# Patient Record
Sex: Male | Born: 1960 | Race: Black or African American | Hispanic: No | Marital: Married | Smoking: Never smoker
Health system: Southern US, Community
[De-identification: ages and names within clinical notes are randomized; demographics above are authoritative.]

## PROBLEM LIST (undated history)

## (undated) DIAGNOSIS — K2950 Unspecified chronic gastritis without bleeding: Secondary | ICD-10-CM

## (undated) HISTORY — PX: EYE SURGERY: SHX253

## (undated) HISTORY — DX: Unspecified chronic gastritis without bleeding: K29.50

---

## 1996-06-28 HISTORY — PX: PAROTID GLAND TUMOR EXCISION: SHX5221

## 1998-02-06 ENCOUNTER — Ambulatory Visit (HOSPITAL_COMMUNITY): Admission: RE | Admit: 1998-02-06 | Discharge: 1998-02-06 | Payer: Self-pay | Admitting: Internal Medicine

## 2000-07-15 ENCOUNTER — Emergency Department (HOSPITAL_COMMUNITY): Admission: EM | Admit: 2000-07-15 | Discharge: 2000-07-15 | Payer: Self-pay | Admitting: Emergency Medicine

## 2000-12-07 ENCOUNTER — Encounter: Admission: RE | Admit: 2000-12-07 | Discharge: 2000-12-07 | Payer: Self-pay | Admitting: *Deleted

## 2008-12-02 ENCOUNTER — Ambulatory Visit (HOSPITAL_COMMUNITY): Admission: RE | Admit: 2008-12-02 | Discharge: 2008-12-02 | Payer: Self-pay | Admitting: *Deleted

## 2010-11-10 NOTE — Op Note (Signed)
NAME:  Raymond French, Raymond French              ACCOUNT NO.:  [id number]   MEDICAL RECORD NO.:  [id number]          PATIENT TYPE:  AMB   LOCATION:  ENDO                         FACILITY:  [facility]   PHYSICIAN:  Georgiana Spinner, M.D.    DATE OF BIRTH:  [birth year]   DATE OF PROCEDURE:  12/02/2008  DATE OF DISCHARGE:                               OPERATIVE REPORT   PROCEDURE:  Upper endoscopy.   INDICATIONS:  Gastroesophageal reflux disease.   ANESTHESIA:  Fentanyl 75 mcg, Versed 7 mg.   PROCEDURE:  With the patient mildly sedated in left lateral decubitus  position, the Pentax videoscopic endoscope was inserted and passed under  direct vision through the esophagus which appeared normal into the  stomach.  Fundus, body, antrum, duodenal bulb, second portion duodenum  all appeared normal.  From this point the endoscope slowly withdrawn  taking circumferential views of duodenal mucosa until the endoscope had  been pulled back into the stomach and placed in retroflexion to view the  stomach from below.  The endoscope was straightened and withdrawn taking  circumferential views remaining gastric and esophageal mucosa.  The  patient's vital signs, pulse oximeter remained stable.  The patient  tolerated procedure well without apparent complications.   FINDINGS:  Rather unremarkable examination.   PLAN:  Proceed to colonoscopy.           ______________________________  Georgiana Spinner, M.D.     GMO/MEDQ  D:  12/02/2008  T:  12/02/2008  Job:  [id number]

## 2010-11-10 NOTE — Op Note (Signed)
NAME:  Raymond French, Raymond French              ACCOUNT NO.:  [id number]   MEDICAL RECORD NO.:  [id number]          PATIENT TYPE:  AMB   LOCATION:  ENDO                         FACILITY:  [facility]   PHYSICIAN:  Georgiana Spinner, M.D.    DATE OF BIRTH:  07/28/1960   DATE OF PROCEDURE:  DATE OF DISCHARGE:                               OPERATIVE REPORT   PROCEDURE:  Colonoscopy.   INDICATIONS:  Colon cancer screening, rectal pain.   ANESTHESIA:  Fentanyl 25 mcg, Versed 2.5 mg.   PROCEDURE:  With the patient in the left lateral decubitus position  before receiving any anesthesia I did a rectal examination which was  unremarkable to my exam, both visually and digital exam although,  prostate was only partially felt.  From this point, the colonoscope was  then inserted into the rectum and passed under direct vision with  pressure applied.  We reached the cecum, identified by ileocecal valve  and base of cecum, both of which were photographed.  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  From this point, the colonoscope was slowly withdrawn taking  circumferential views of the colonic mucosa stopping in the rectum which  appeared normal on direct and showed hemorrhoids on retroflexed view.  The endoscope was straightened and withdrawn.  The patient's vital signs  and pulse oximeter remained stable.  The patient tolerated the procedure  well without apparent complication.   FINDINGS:  AVM of cecum.  Internal hemorrhoids.  Otherwise, an  unremarkable exam.   PLAN:  To have patient follow up with me as an outpatient.           ______________________________  Georgiana Spinner, M.D.     GMO/MEDQ  D:  12/02/2008  T:  12/02/2008  Job:  [id number]

## 2010-12-29 ENCOUNTER — Other Ambulatory Visit: Payer: Self-pay | Admitting: Otolaryngology

## 2010-12-29 DIAGNOSIS — R609 Edema, unspecified: Secondary | ICD-10-CM

## 2010-12-31 ENCOUNTER — Other Ambulatory Visit: Payer: Self-pay

## 2014-03-21 ENCOUNTER — Emergency Department (HOSPITAL_COMMUNITY): Payer: No Typology Code available for payment source

## 2014-03-21 ENCOUNTER — Encounter (HOSPITAL_COMMUNITY): Payer: Self-pay | Admitting: Emergency Medicine

## 2014-03-21 ENCOUNTER — Emergency Department (HOSPITAL_COMMUNITY)
Admission: EM | Admit: 2014-03-21 | Discharge: 2014-03-21 | Disposition: A | Discharge status: Home / Self Care | Payer: No Typology Code available for payment source | Attending: Emergency Medicine | Admitting: Emergency Medicine

## 2014-03-21 DIAGNOSIS — Y9389 Activity, other specified: Secondary | ICD-10-CM | POA: Diagnosis not present

## 2014-03-21 DIAGNOSIS — Y92410 Unspecified street and highway as the place of occurrence of the external cause: Secondary | ICD-10-CM | POA: Insufficient documentation

## 2014-03-21 DIAGNOSIS — S46909A Unspecified injury of unspecified muscle, fascia and tendon at shoulder and upper arm level, unspecified arm, initial encounter: Secondary | ICD-10-CM | POA: Diagnosis not present

## 2014-03-21 DIAGNOSIS — S59919A Unspecified injury of unspecified forearm, initial encounter: Secondary | ICD-10-CM | POA: Diagnosis not present

## 2014-03-21 DIAGNOSIS — M25532 Pain in left wrist: Secondary | ICD-10-CM

## 2014-03-21 DIAGNOSIS — S4980XA Other specified injuries of shoulder and upper arm, unspecified arm, initial encounter: Secondary | ICD-10-CM | POA: Diagnosis not present

## 2014-03-21 DIAGNOSIS — S59909A Unspecified injury of unspecified elbow, initial encounter: Secondary | ICD-10-CM | POA: Insufficient documentation

## 2014-03-21 DIAGNOSIS — M25531 Pain in right wrist: Secondary | ICD-10-CM

## 2014-03-21 DIAGNOSIS — S6990XA Unspecified injury of unspecified wrist, hand and finger(s), initial encounter: Secondary | ICD-10-CM | POA: Insufficient documentation

## 2014-03-21 MED ORDER — TRAMADOL HCL 50 MG PO TABS: 50.0000 mg | ORAL_TABLET | Freq: Four times a day (QID) | ORAL | Status: DC | PRN

## 2014-03-21 MED ORDER — CYCLOBENZAPRINE HCL 10 MG PO TABS: 10.0000 mg | ORAL_TABLET | Freq: Two times a day (BID) | ORAL | Status: DC | PRN

## 2014-03-21 MED ORDER — HYDROCODONE-ACETAMINOPHEN 5-325 MG PO TABS
2.0000 | ORAL_TABLET | Freq: Once | ORAL | Status: AC
  Administered 2014-03-21: 2 via ORAL
  Filled 2014-03-21: qty 2

## 2014-03-21 NOTE — Discharge Instructions (Signed)
Take Tramadol as needed for pain. Take Flexeril as needed for muscle spasm. Refer to attached documents for more information.  °

## 2014-03-21 NOTE — Progress Notes (Signed)
[facility] Community Liaison Stacy, ° °Provided pt with a list of self-pay providers to help patient establish primary care.  °
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
## 2014-03-24 NOTE — ED Provider Notes (Signed)
Medical screening examination/treatment/procedure(s) were performed by non-physician practitioner and as supervising physician I was immediately available for consultation/collaboration.   EKG Interpretation None        Britani Beattie, MD 03/24/14 1535 

## 2020-08-18 ENCOUNTER — Other Ambulatory Visit: Payer: Self-pay

## 2020-08-18 ENCOUNTER — Emergency Department (HOSPITAL_COMMUNITY)
Admission: EM | Admit: 2020-08-18 | Discharge: 2020-08-18 | Disposition: A | Discharge status: Home / Self Care | Payer: Commercial Managed Care - PPO | Attending: Emergency Medicine | Admitting: Emergency Medicine

## 2020-08-18 ENCOUNTER — Encounter (HOSPITAL_COMMUNITY): Payer: Self-pay | Admitting: Emergency Medicine

## 2020-08-18 DIAGNOSIS — S161XXA Strain of muscle, fascia and tendon at neck level, initial encounter: Secondary | ICD-10-CM | POA: Insufficient documentation

## 2020-08-18 DIAGNOSIS — Y92410 Unspecified street and highway as the place of occurrence of the external cause: Secondary | ICD-10-CM | POA: Diagnosis not present

## 2020-08-18 DIAGNOSIS — M545 Low back pain, unspecified: Secondary | ICD-10-CM | POA: Diagnosis not present

## 2020-08-18 DIAGNOSIS — S199XXA Unspecified injury of neck, initial encounter: Secondary | ICD-10-CM | POA: Diagnosis present

## 2020-08-18 MED ORDER — METHOCARBAMOL 500 MG PO TABS: 500.0000 mg | ORAL_TABLET | Freq: Two times a day (BID) | ORAL | 0 refills | Status: DC

## 2020-08-18 NOTE — Discharge Instructions (Signed)
Please pick up medication and take as prescribed. DO NOT DRIVE WHILE ON THE MUSCLE RELAXER AS IT CAN MAKE YOU DROWSY.   Continue taking the meloxicam that you have prescribed as well.   Please follow up with your PCP regarding your ED visit today and for reevaluation of your symptoms.   Return to the ED IMMEDIATELY for any worsening symptoms including worsening pain, new weakness/numbness/tingling to your arms or legs, numbness in your groin, inability to ambulate, holding onto urine, peeing or pooping on yourself, or any other new/concerning symptoms.
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
## 2021-08-14 ENCOUNTER — Ambulatory Visit (HOSPITAL_COMMUNITY)
Admission: EM | Admit: 2021-08-14 | Discharge: 2021-08-14 | Disposition: A | Discharge status: Home / Self Care | Payer: 59 | Attending: Family Medicine | Admitting: Family Medicine

## 2021-08-14 ENCOUNTER — Encounter (HOSPITAL_COMMUNITY): Payer: Self-pay

## 2021-08-14 ENCOUNTER — Ambulatory Visit (INDEPENDENT_AMBULATORY_CARE_PROVIDER_SITE_OTHER): Payer: 59

## 2021-08-14 DIAGNOSIS — M79674 Pain in right toe(s): Secondary | ICD-10-CM | POA: Diagnosis not present

## 2021-08-14 MED ORDER — IBUPROFEN 800 MG PO TABS: 800.0000 mg | ORAL_TABLET | Freq: Three times a day (TID) | ORAL | 0 refills | Status: DC | PRN

## 2021-08-14 NOTE — Discharge Instructions (Addendum)
Your x-ray is negative for fracture. You can take ibuprofen 800 mg 1 every 8 hours as needed for pain  Ice and elevate your foot
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
## 2021-08-14 NOTE — ED Provider Notes (Addendum)
[facility]    CSN: [id number] Arrival date & time: 08/14/21  1851      History   Chief Complaint Chief Complaint  Patient presents with   Toe Injury    HPI Raymond French is a 61 y.o. male.   HPI Here for right fourth and fifth toe pain.  This began this morning after he stubbed his lateral toes on a couch.    History reviewed. No pertinent past medical history.  There are no problems to display for this patient.   Past Surgical History:  Procedure Laterality Date   EYE SURGERY     right eye       Home Medications    Prior to Admission medications   Medication Sig Start Date End Date Taking? Authorizing Provider  ibuprofen (ADVIL) 800 MG tablet Take 1 tablet (800 mg total) by mouth every 8 (eight) hours as needed (pain). 08/14/21  Yes Fredricka Kohrs, Janace Aris, MD    Family History Family History  Family history unknown: Yes    Social History Social History   Tobacco Use   Smoking status: Never  Substance Use Topics   Alcohol use: Yes     Allergies   Patient has no known allergies.   Review of Systems Review of Systems   Physical Exam Triage Vital Signs ED Triage Vitals  Enc Vitals Group     BP 08/14/21 1958 139/86     Pulse Rate 08/14/21 1958 77     Resp 08/14/21 1958 18     Temp 08/14/21 1958 98.5 F (36.9 C)     Temp Source 08/14/21 1958 Oral     SpO2 08/14/21 1958 100 %     Weight --      Height --      Head Circumference --      Peak Flow --      Pain Score 08/14/21 1959 7     Pain Loc --      Pain Edu? --      Excl. in GC? --    No data found.  Updated Vital Signs BP 139/86 (BP Location: Right Arm)    Pulse 77    Temp 98.5 F (36.9 C) (Oral)    Resp 18    SpO2 100%   Visual Acuity Right Eye Distance:   Left Eye Distance:   Bilateral Distance:    Right Eye Near:   Left Eye Near:    Bilateral Near:     Physical Exam Vitals reviewed.  Constitutional:      General: He is not in acute distress.     Appearance: He is not ill-appearing or toxic-appearing.  Musculoskeletal:        General: Tenderness (dorsum of right 4th toe is tender. Is a little swollen) present.  Skin:    Coloration: Skin is not pale.  Neurological:     Mental Status: He is alert.     UC Treatments / Results  Labs (all labs ordered are listed, but only abnormal results are displayed) Labs Reviewed - No data to display  EKG   Radiology DG Foot Complete Right  Result Date: 08/14/2021 CLINICAL DATA:  Blunt trauma to the foot, initial encounter EXAM: RIGHT FOOT COMPLETE - 3+ VIEW COMPARISON:  None. FINDINGS: Small calcaneal spurs are noted. No acute fracture or dislocation is noted. No acute soft tissue abnormality is seen. IMPRESSION: No acute abnormality noted. Electronically Signed   By: Alcide Clever M.D.   On: 08/14/2021  20:26    Procedures Procedures (including critical care time)  Medications Ordered in UC Medications - No data to display  Initial Impression / Assessment and Plan / UC Course  I have reviewed the triage vital signs and the nursing notes.  Pertinent labs & imaging results that were available during my care of the patient were reviewed by me and considered in my medical decision making (see chart for details).     X-ray is negative for fracture.  We will have him ice and elevate it. Final Clinical Impressions(s) / UC Diagnoses   Final diagnoses:  Pain of toe of right foot     Discharge Instructions      Your x-ray is negative for fracture. You can take ibuprofen 800 mg 1 every 8 hours as needed for pain  Ice and elevate your foot      ED Prescriptions     Medication Sig Dispense Auth. Provider   ibuprofen (ADVIL) 800 MG tablet Take 1 tablet (800 mg total) by mouth every 8 (eight) hours as needed (pain). 21 tablet Neri Vieyra, Janace Aris, MD      I have reviewed the PDMP during this encounter.   Zenia Resides, MD 08/14/21 2032    Zenia Resides,  MD 08/14/21 2033

## 2022-03-04 IMAGING — DX DG FOOT COMPLETE 3+V*R*
3 series · 3 of 3 positions shown · non-contrast
Comparison: None.

CLINICAL DATA: Blunt trauma to the foot, initial encounter

EXAM:
RIGHT FOOT COMPLETE - 3+ VIEW

[foot ap]
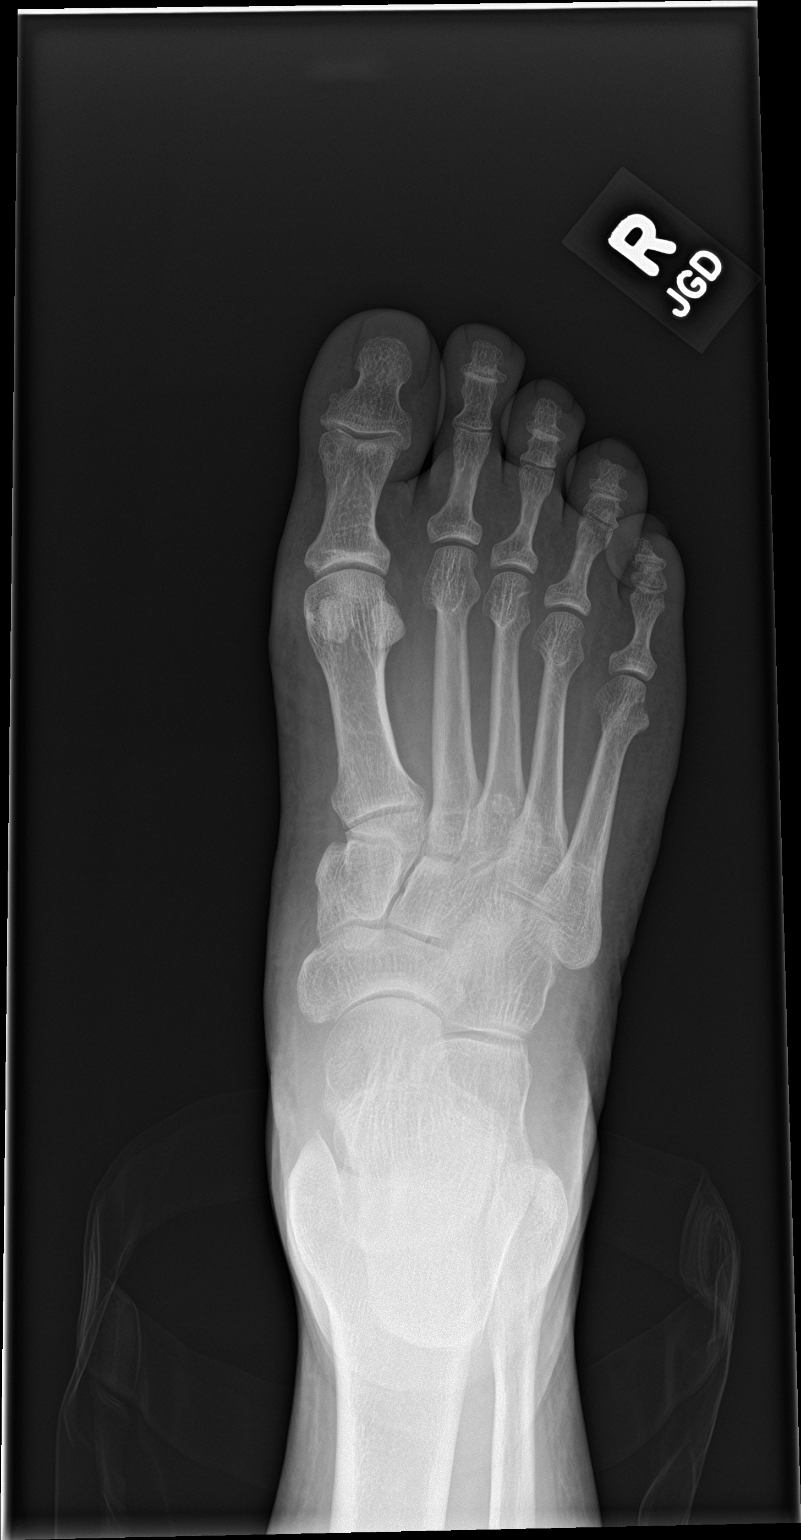

[foot obl]
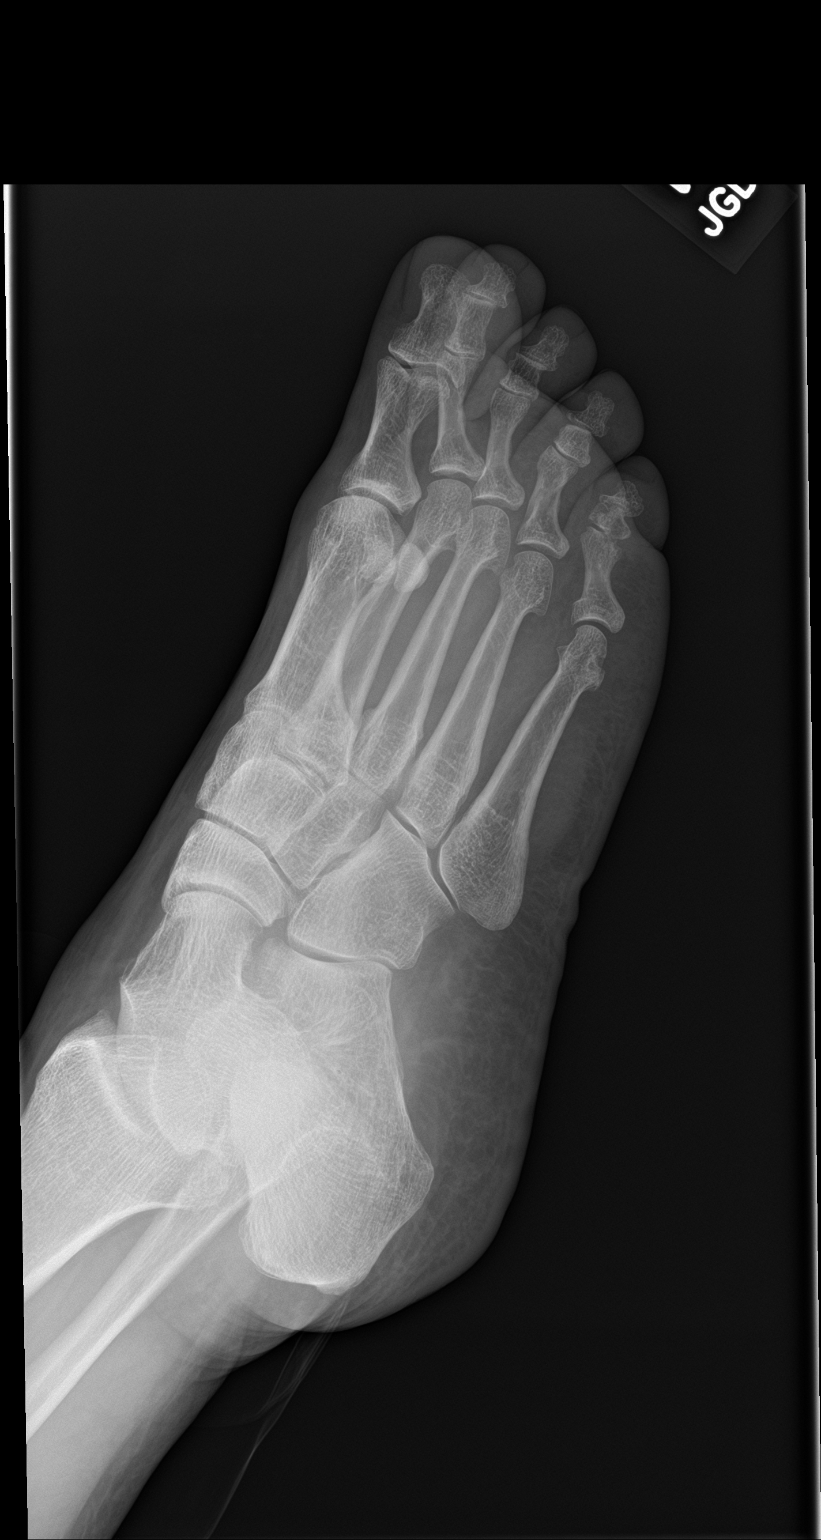

[foot lat]
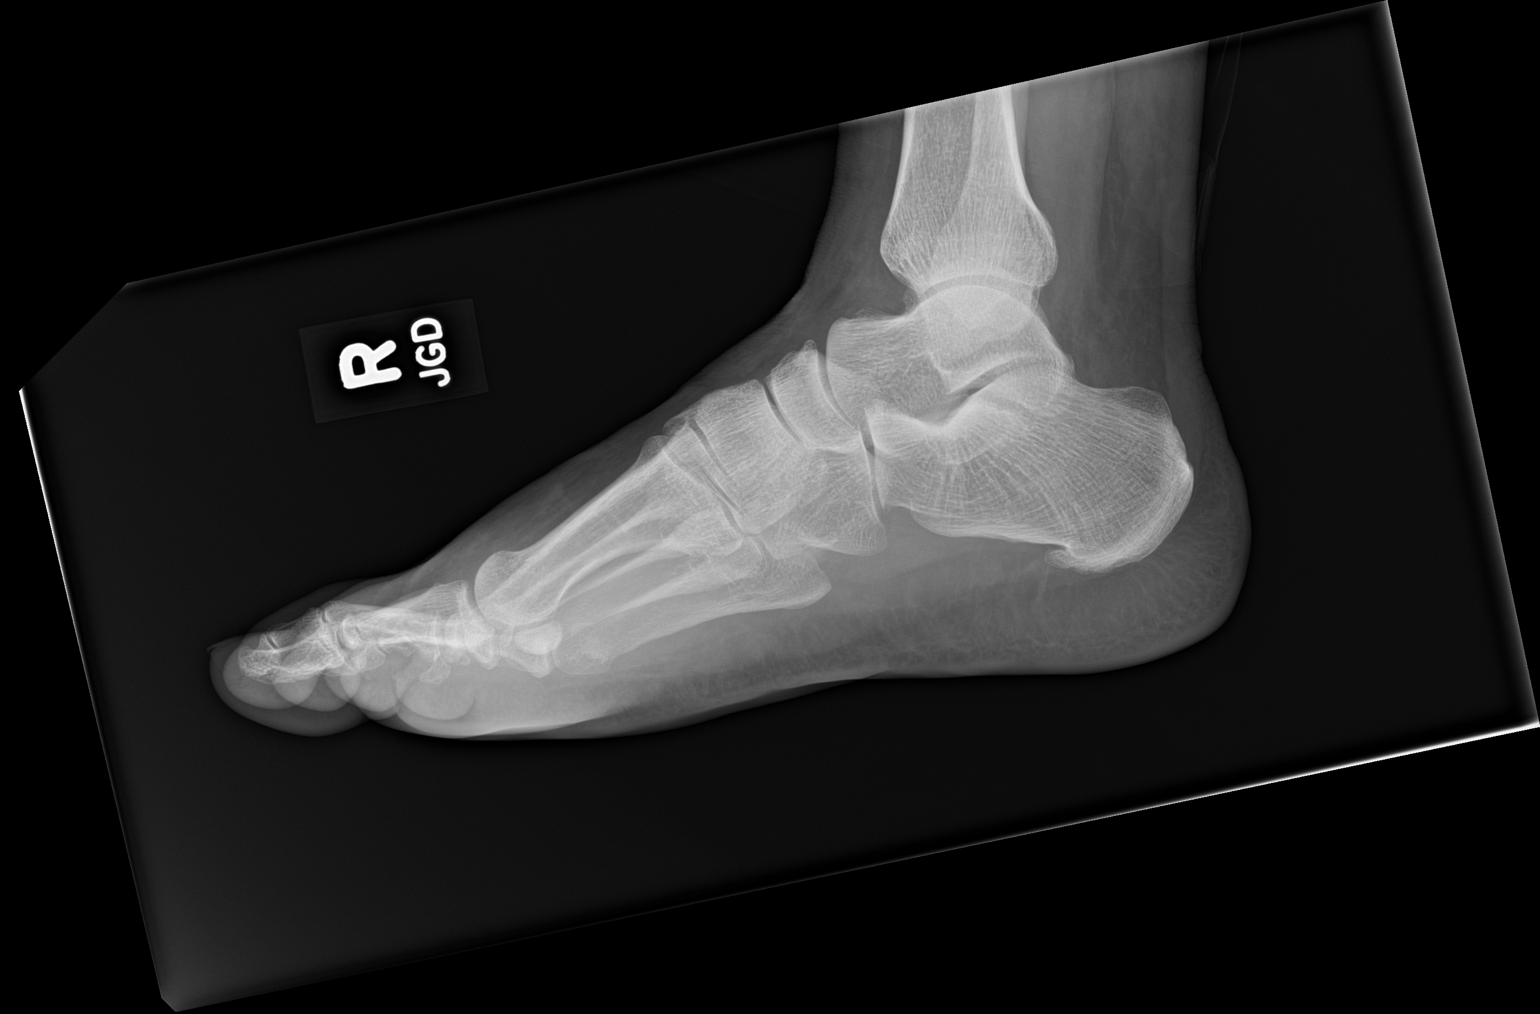

[3 of 3 positions shown; findings below may reference images not displayed]

FINDINGS: Small calcaneal spurs are noted. No acute fracture or dislocation is
noted. No acute soft tissue abnormality is seen.
IMPRESSION: No acute abnormality noted.

## 2022-04-07 NOTE — Progress Notes (Signed)
Office Visit Note  Patient: Raymond French             Date of Birth: 1961/05/21           MRN: [id number]             PCP: Robert Bellow, PA-C Referring: Erle Crocker, MD Visit Date: 04/15/2022 Occupation: @GUAROCC @  Subjective:  Right 5th toe swelling   History of Present Illness: Raymond French is a 61 y.o. male who presents today for a new patient consultation as requested by Dr. Lucia Gaskins for further evaluation of right fifth toe swelling.  According to the patient in mid-September he noticed swelling in the right fifth toe and 1 week later sought evaluation at urgent care on 03/18/2022.  He was having difficulty wearing his shoe and had to wear flip-flops due to the amount of swelling and discomfort. The week leading up to his symptom onset he states he had more shrimp and alcohol use than normal.  He denies any other triggers for the onset of symptoms.  He denies any injury. He states there was concern for possible infection and states that x-rays and lab work was performed at urgent care.  He followed the next day at Dr. Pollie Friar office on 03/19/2022.  There was concern for possible gout and he was referred to our office for further evaluation.  The patient was given a Depo-Medrol injection as well as a prednisone taper which resolved the swelling in his right toe.  He states that he did not like the way prednisone made him feel since he noticed increased bloating and constipation.  He states that he continues to notice skin peeling over the right fifth toe.  He denies any known past medical history or family history of gout.  According to the patient his mother had arthritis but he is unsure of what type.    He currently rates the pain in his right fifth toe a 1 out of 10. 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  He states about 1 year ago he noticed swelling in the right third PIP  joint.  He was given a prescription for meloxicam 15 mg 1 tablet daily to take as needed.  He takes meloxicam very sparingly for pain relief.  He states in the past he had an NCV with EMG which was normal.  He states about 2 years ago he had an injection in his right shoulder which has alleviated his symptoms.  He has occasional discomfort in both knee joints but denies any joint swelling.   Lab work from 03/18/22 were reviewed today in the office: ESR 37, 8.2, uric acid 7.9.  X-rays of the right foot on 08/14/21: no acute abnormality noted.   Activities of Daily Living:  Patient reports morning stiffness for 0  none .   Patient Denies nocturnal pain.  Difficulty dressing/grooming: Denies Difficulty climbing stairs: Denies Difficulty getting out of chair: Denies Difficulty using hands for taps, buttons, cutlery, and/or writing: Denies  Review of Systems  Constitutional:  Negative for fatigue.  HENT:  Negative for mouth sores and mouth dryness.   Eyes:  Positive for dryness.  Respiratory:  Negative for shortness of breath.   Cardiovascular:  Negative for chest pain and palpitations.  Gastrointestinal:  Negative for blood in stool, constipation and diarrhea.  Endocrine: Negative for increased urination.  Genitourinary:  Negative for  involuntary urination.  Musculoskeletal:  Positive for joint pain and joint pain. Negative for gait problem, joint swelling, myalgias, muscle weakness, morning stiffness, muscle tenderness and myalgias.  Skin:  Negative for color change, rash, hair loss and sensitivity to sunlight.  Allergic/Immunologic: Negative for susceptible to infections.  Neurological:  Negative for dizziness and headaches.  Hematological:  Negative for swollen glands.  Psychiatric/Behavioral:  Negative for depressed mood and sleep disturbance. The patient is not nervous/anxious.     PMFS History:  There are no problems to display for this patient.   Past Medical History:  Diagnosis  Date   Chronic gastritis     Family History  Problem Relation Age of Onset   Hypertension Mother    Kidney failure Mother    Hypertension Father    Leukemia Father    Hypertension Sister    Cancer Sister    Bradycardia Sister    Hypertension Sister    Cancer Brother    Cancer Brother    Cancer Brother    Past Surgical History:  Procedure Laterality Date   EYE SURGERY     right eye   PAROTID GLAND TUMOR EXCISION Left 1998   Benign Cyst   Social History   Social History Narrative   Not on file    There is no immunization history on file for this patient.   Objective: Vital Signs: BP (!) 143/93 (BP Location: Right Arm, Patient Position: Sitting, Cuff Size: Small)   Pulse (!) 54   Resp 13   Ht 5' 8"  (1.727 m)   Wt 221 lb (100.2 kg)   BMI 33.60 kg/m    Physical Exam Vitals and nursing note reviewed.  Constitutional:      Appearance: He is well-developed.  HENT:     Head: Normocephalic and atraumatic.  Eyes:     Conjunctiva/sclera: Conjunctivae normal.     Pupils: Pupils are equal, round, and reactive to light.  Cardiovascular:     Rate and Rhythm: Normal rate and regular rhythm.     Heart sounds: Normal heart sounds.  Pulmonary:     Effort: Pulmonary effort is normal.     Breath sounds: Normal breath sounds.  Abdominal:     General: Bowel sounds are normal.     Palpations: Abdomen is soft.  Musculoskeletal:     Cervical back: Normal range of motion and neck supple.  Skin:    General: Skin is warm and dry.     Capillary Refill: Capillary refill takes less than 2 seconds.     Comments: Postinflammatory skin peeling noted overlying the right fifth and extending up the dorsal aspect of the right foot. Erythema over the left 4th MTP joint.  Neurological:     Mental Status: He is alert and oriented to person, place, and time.  Psychiatric:        Behavior: Behavior normal.      Musculoskeletal Exam: C-spine, thoracic spine, lumbar spine have good range of  motion with no discomfort.  Shoulder joints, elbow joints, wrist joints, MCPs, PIPs, DIPs have good range of motion with no synovitis.  Some tenderness over PIP joints but no active inflammation noted.  Complete fist formation bilaterally.  Hip joints have good range of motion with no groin pain.  Knee joints have good range of motion with no warmth or effusion.  Ankle joints have good range of motion with no tenderness or synovitis.  No evidence of Achilles tendinitis or plantar fasciitis.  Some tenderness over the  right fifth MTP joint and right fourth PIP joint.  Postinflammatory skin peeling noted over the left fifth toe.  Erythema noted over the left fourth MTP joint but no tenderness or synovitis was noted.  CDAI Exam: CDAI Score: -- Patient Global: --; Provider Global: -- Swollen: --; Tender: -- Joint Exam 04/15/2022   No joint exam has been documented for this visit   There is currently no information documented on the homunculus. Go to the Rheumatology activity and complete the homunculus joint exam.  Investigation: No additional findings.  Imaging: No results found.  Recent Labs: No results found for: "WBC", "HGB", "PLT", "NA", "K", "CL", "CO2", "GLUCOSE", "BUN", "CREATININE", "BILITOT", "ALKPHOS", "AST", "ALT", "PROT", "ALBUMIN", "CALCIUM", "GFRAA", "QFTBGOLD", "QFTBGOLDPLUS"  Speciality Comments: No specialty comments available.  Procedures:  No procedures performed Allergies: Patient has no known allergies.   Assessment / Plan:     Visit Diagnoses: Pain in right foot - Evaluated by Dr. Lucia Gaskins.  Patient presents today for evaluation of right fifth toe swelling.  His symptoms started in mid-September prompting evaluation at urgent care 1 week later on 03/18/22.  He had no injury prior to the onset of symptoms.  Of note he did had more shrimp than usual as well as alcohol use prior to the onset of symptoms.  He has no known past medical history of gout and no family history of  gout.  He had x-rays of the right foot along with lab work at urgent care on 03/18/2022: Uric acid 7.9, ESR 37, CRP 8.2.  There was concern for possible infection so he was evaluated at Dr. Pollie Friar office the following day on 03/19/2022.  There was low suspicion for infection at that time, so he was referred to [place] due to the concern for gout.  The patient has had a Depo-Medrol injection as well as a 7-day prednisone taper which has improved the swelling and discomfort in his right fifth toe. He continues to have postinflammatory skin peeling of the right fifth toe.  His pain is currently a 1 out of 10.  He has no other joint tenderness or synovitis on examination today. Discussed the diagnosis of gout today in detail.  Discussed dietary modifications as well as potential prophylactic medication options in the future.  This was his first attack so we will hold off on adding a urate lowering medication at this time.  Patient was given an informational handout about dietary recommendations for patients with gout.  Discussed the use of colchicine which he can take as needed if he has a recurrence of symptoms.  Indications, contraindications, and potential side effects of colchicine were discussed today in detail.  All questions were addressed.  A prescription will be sent to the pharmacy for colchicine 0.6 mg 1 tablet daily as needed during flares pending lab results today. X-rays of both hands and both feet were obtained for further evaluation along with the following lab work.  He will follow-up in 1 to 2 weeks and we will further discuss results and treatment options at that time.  - Plan: Rheumatoid factor, 14-3-3 eta Protein, Cyclic citrul peptide antibody, IgG, Uric acid, Sedimentation rate, C-reactive protein, XR Foot 2 Views Right, XR Foot 2 Views Left, COMPLETE METABOLIC PANEL WITH GFR, CBC with Differential/Platelet  Pain in both hands -Patient experiences intermittent pain and stiffness in both hands  exacerbated by overuse or repetitive activities.  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  According to the patient  at 1 year ago he experienced significant swelling in the right third PIP joint.  He was given a prescription for meloxicam 15 mg 1 tablet daily as needed for pain relief.  He takes meloxicam very sparingly.  He has not had any recurrence of joint swelling in his hands.  According to the patient his mother had arthritis but he is unsure of the type of arthritis.   On examination he has some tenderness over PIP joints.  PIP and DIP thickening consistent with osteoarthritis of both hands noted.  No obvious synovitis was noted on examination today. X-rays of both hands were obtained today for further evaluation.  The following lab work was also obtained.  Results will be discussed at his upcoming follow-up visit.  Plan: Rheumatoid factor, 14-3-3 eta Protein, Cyclic citrul peptide antibody, IgG, Sedimentation rate, C-reactive protein, XR Hand 2 View Left, XR Hand 2 View Right  Pain in both feet -Right 4th proximal phalanx fracture-Spring 2023. X-rays of both feet were ordered today for further evaluation.  Plan: XR Foot 2 Views Right, XR Foot 2 Views Left  Radiculopathy, lumbar region: He has had injections in his lower back in the past.  No symptoms of radiculopathy at this time.  History of spinal stenosis  Other medical conditions are listed as follows:   Hypotestosteronism  Hypogonadism in male  History of gastroesophageal reflux (GERD)  History of glaucoma    Orders: Orders Placed This Encounter  Procedures   XR Hand 2 View Left   XR Hand 2 View Right   XR Foot 2 Views Right   XR Foot 2 Views Left   Rheumatoid factor   14-3-3 eta Protein   Cyclic citrul peptide antibody, IgG   Uric acid   Sedimentation rate   C-reactive protein   COMPLETE METABOLIC PANEL WITH GFR   CBC with Differential/Platelet   No orders of the defined  types were placed in this encounter.   Follow-Up Instructions: Return for Pain in right 5th toe.   Ofilia Neas, PA-C  Note - This record has been created using Dragon software.  Chart creation errors have been sought, but may not always  have been located. Such creation errors do not reflect on  the standard of medical care.

## 2022-04-15 ENCOUNTER — Encounter: Payer: Self-pay | Admitting: Physician Assistant

## 2022-04-15 ENCOUNTER — Ambulatory Visit (INDEPENDENT_AMBULATORY_CARE_PROVIDER_SITE_OTHER): Payer: 59

## 2022-04-15 ENCOUNTER — Ambulatory Visit: Payer: 59 | Attending: Physician Assistant | Admitting: Physician Assistant

## 2022-04-15 ENCOUNTER — Other Ambulatory Visit: Payer: Self-pay

## 2022-04-15 VITALS — BP 143/93 | HR 54 | Resp 13 | Ht 68.0 in | Wt 221.0 lb

## 2022-04-15 DIAGNOSIS — E349 Endocrine disorder, unspecified: Secondary | ICD-10-CM

## 2022-04-15 DIAGNOSIS — E291 Testicular hypofunction: Secondary | ICD-10-CM

## 2022-04-15 DIAGNOSIS — M79642 Pain in left hand: Secondary | ICD-10-CM | POA: Diagnosis not present

## 2022-04-15 DIAGNOSIS — M79672 Pain in left foot: Secondary | ICD-10-CM

## 2022-04-15 DIAGNOSIS — Z8719 Personal history of other diseases of the digestive system: Secondary | ICD-10-CM

## 2022-04-15 DIAGNOSIS — M79641 Pain in right hand: Secondary | ICD-10-CM | POA: Diagnosis not present

## 2022-04-15 DIAGNOSIS — M5416 Radiculopathy, lumbar region: Secondary | ICD-10-CM

## 2022-04-15 DIAGNOSIS — Z8739 Personal history of other diseases of the musculoskeletal system and connective tissue: Secondary | ICD-10-CM

## 2022-04-15 DIAGNOSIS — Z8669 Personal history of other diseases of the nervous system and sense organs: Secondary | ICD-10-CM

## 2022-04-15 DIAGNOSIS — M79671 Pain in right foot: Secondary | ICD-10-CM | POA: Diagnosis not present

## 2022-04-15 NOTE — Patient Instructions (Signed)

## 2022-04-15 NOTE — Telephone Encounter (Signed)
Pending lab results (CBC and CMP), patient will be starting colchicine per Hazel Sams, PA-C. Thanks!

## 2022-04-16 MED ORDER — COLCHICINE 0.6 MG PO TABS: 0.6000 mg | ORAL_TABLET | Freq: Every day | ORAL | 2 refills | Status: DC

## 2022-04-16 NOTE — Progress Notes (Deleted)
Office Visit Note  Patient: Raymond French             Date of Birth: [birth year]           MRN: [id number]             PCP: Robert Bellow, PA-C Referring: Rip Harbour Visit Date: 04/27/2022 Occupation: @GUAROCC @  Subjective:  No chief complaint on file.   History of Present Illness: Raymond French is a 61 y.o. male ***   Activities of Daily Living:  Patient reports morning stiffness for *** {minute/hour:19697}.   Patient {ACTIONS;DENIES/REPORTS:21021675::"Denies"} nocturnal pain.  Difficulty dressing/grooming: {ACTIONS;DENIES/REPORTS:21021675::"Denies"} Difficulty climbing stairs: {ACTIONS;DENIES/REPORTS:21021675::"Denies"} Difficulty getting out of chair: {ACTIONS;DENIES/REPORTS:21021675::"Denies"} Difficulty using hands for taps, buttons, cutlery, and/or writing: {ACTIONS;DENIES/REPORTS:21021675::"Denies"}  No Rheumatology ROS completed.   PMFS History:  There are no problems to display for this patient.   Past Medical History:  Diagnosis Date   Chronic gastritis     Family History  Problem Relation Age of Onset   Hypertension Mother    Kidney failure Mother    Hypertension Father    Leukemia Father    Hypertension Sister    Cancer Sister    Bradycardia Sister    Hypertension Sister    Cancer Brother    Cancer Brother    Cancer Brother    Past Surgical History:  Procedure Laterality Date   EYE SURGERY     right eye   PAROTID GLAND TUMOR EXCISION Left 1998   Benign Cyst   Social History   Social History Narrative   Not on file    There is no immunization history on file for this patient.   Objective: Vital Signs: There were no vitals taken for this visit.   Physical Exam   Musculoskeletal Exam: ***  CDAI Exam: CDAI Score: -- Patient Global: --; Provider Global: -- Swollen: --; Tender: -- Joint Exam 04/27/2022   No joint exam has been documented for this visit   There is currently no information documented on the homunculus.  Go to the Rheumatology activity and complete the homunculus joint exam.  Investigation: No additional findings.  Imaging: XR Foot 2 Views Left  Result Date: 04/15/2022 First MTP, PIP and DIP narrowing was noted.  No intertarsal, tibiotalar or subtalar joint space narrowing was noted.  Inferior calcaneal spur was noted. Impression: These findings are consistent with osteoarthritis of the foot.  XR Foot 2 Views Right  Result Date: 04/15/2022 First MTP, PIP and DIP narrowing was noted.  No intertarsal, tibiotalar or subtalar joint space narrowing was noted.  Inferior calcaneal spur was noted. Impression: These findings are consistent with osteoarthritis of the foot.  XR Hand 2 View Left  Result Date: 04/15/2022 CMC, PIP and DIP narrowing was noted.  No MCP narrowing was noted.  Erosive changes were noted at the base of second and fifth proximal phalanx. Impression: These findings are consistent with osteoarthritis and gouty arthropathy overlap.  XR Hand 2 View Right  Result Date: 04/15/2022 PIP and DIP narrowing was noted.  No MCP, intercarpal or radiocarpal joint space narrowing was noted.  Probable cystic versus erosive changes were noted over second, fourth and fifth PIPs. 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   Recent Labs: Lab Results  Component Value Date   WBC 3.8 04/15/2022   HGB 14.9 04/15/2022   PLT 215 04/15/2022   NA 141 04/15/2022   K 4.7 04/15/2022   CL 104 04/15/2022  CO2 30 04/15/2022   GLUCOSE 87 04/15/2022   BUN 15 04/15/2022   CREATININE 1.33 04/15/2022   BILITOT 1.0 04/15/2022   AST 17 04/15/2022   ALT 21 04/15/2022   PROT 7.2 04/15/2022   CALCIUM 9.4 04/15/2022    Speciality Comments: No specialty comments available.  Procedures:  No procedures performed Allergies: Patient has no known allergies.   Assessment / Plan:     Visit Diagnoses: Hyperuricemia - Uric acid 7.5 on 04/15/22.  ESR  and CRP WNL.  RF and Anti-CCP negative.   Pain in right foot  Primary osteoarthritis of both hands - XR 04/15/22: Consistent with OA and gout arthropathy overlap  Primary osteoarthritis of both feet - XR consistent with OA of both feet 04/15/22  Radiculopathy, lumbar region  History of spinal stenosis  Hypotestosteronism  Hypogonadism in male  History of gastroesophageal reflux (GERD)  History of glaucoma  Orders: No orders of the defined types were placed in this encounter.  No orders of the defined types were placed in this encounter.   Face-to-face time spent with patient was *** minutes. Greater than 50% of time was spent in counseling and coordination of care.  Follow-Up Instructions: No follow-ups on file.   Ofilia Neas, PA-C  Note - This record has been created using Dragon software.  Chart creation errors have been sought, but may not always  have been located. Such creation errors do not reflect on  the standard of medical care.,

## 2022-04-16 NOTE — Telephone Encounter (Signed)
colchicine 0.6 mg 1 tablet daily as needed during flares pending lab results today.  Labs resulted.

## 2022-04-21 NOTE — Telephone Encounter (Signed)
As discussed at his new patient appointment we will discuss x-rays and lab results at his new patient follow-up visit.  If he is unable to make that visit we can schedule a virtual visit.  He was given a prescription of colchicine at his new patient appointment.

## 2022-04-22 LAB — CBC WITH DIFFERENTIAL/PLATELET
Absolute Monocytes: 346 cells/uL (ref 200–950)
Basophils Absolute: 49 cells/uL (ref 0–200)
Basophils Relative: 1.3 %
Eosinophils Absolute: 87 cells/uL (ref 15–500)
Eosinophils Relative: 2.3 %
HCT: 44.9 % (ref 38.5–50.0)
Hemoglobin: 14.9 g/dL (ref 13.2–17.1)
Lymphs Abs: 1984 cells/uL (ref 850–3900)
MCH: 29.2 pg (ref 27.0–33.0)
MCHC: 33.2 g/dL (ref 32.0–36.0)
MCV: 88 fL (ref 80.0–100.0)
MPV: 11.5 fL (ref 7.5–12.5)
Monocytes Relative: 9.1 %
Neutro Abs: 1334 cells/uL — ABNORMAL LOW (ref 1500–7800)
Neutrophils Relative %: 35.1 %
Platelets: 215 10*3/uL (ref 140–400)
RBC: 5.1 10*6/uL (ref 4.20–5.80)
RDW: 12.9 % (ref 11.0–15.0)
Total Lymphocyte: 52.2 %
WBC: 3.8 10*3/uL (ref 3.8–10.8)

## 2022-04-22 LAB — 14-3-3 ETA PROTEIN: 14-3-3 eta Protein: <0.2 ng/mL (ref <0.2)

## 2022-04-22 LAB — COMPLETE METABOLIC PANEL WITHOUT GFR
AG Ratio: 1.6 (calc) (ref 1.0–2.5)
ALT: 21 U/L (ref 9–46)
AST: 17 U/L (ref 10–35)
Albumin: 4.4 g/dL (ref 3.6–5.1)
Alkaline phosphatase (APISO): 62 U/L (ref 35–144)
BUN: 15 mg/dL (ref 7–25)
CO2: 30 mmol/L (ref 20–32)
Calcium: 9.4 mg/dL (ref 8.6–10.3)
Chloride: 104 mmol/L (ref 98–110)
Creat: 1.33 mg/dL (ref 0.70–1.35)
Globulin: 2.8 g/dL (ref 1.9–3.7)
Glucose, Bld: 87 mg/dL (ref 65–99)
Potassium: 4.7 mmol/L (ref 3.5–5.3)
Sodium: 141 mmol/L (ref 135–146)
Total Bilirubin: 1 mg/dL (ref 0.2–1.2)
Total Protein: 7.2 g/dL (ref 6.1–8.1)
eGFR: 61 mL/min/1.73m2 (ref >=60)

## 2022-04-22 LAB — CYCLIC CITRUL PEPTIDE ANTIBODY, IGG: Cyclic Citrullin Peptide Ab: <16 UNITS

## 2022-04-22 LAB — SEDIMENTATION RATE: Sed Rate: 11 mm/h (ref 0–20)

## 2022-04-22 LAB — C-REACTIVE PROTEIN: CRP: 3.8 mg/L (ref <8.0)

## 2022-04-22 LAB — URIC ACID: Uric Acid, Serum: 7.5 mg/dL (ref 4.0–8.0)

## 2022-04-22 LAB — RHEUMATOID FACTOR: Rheumatoid fact SerPl-aCnc: <14 IU/mL (ref <14)

## 2022-04-22 NOTE — Progress Notes (Signed)
CMP WNL.  Absolute neutrophils are borderline low. Rest of CBC WNL.   RF negative. Anti-CCP negative. 14-3-3 eta negative.  ESR WNL. CRP WNL.   Uric acid is elevated-7.5. we will discuss gout in detail at his upcoming follow up visit.

## 2022-04-23 NOTE — Progress Notes (Deleted)
Office Visit Note  Patient: Raymond French             Date of Birth: [birth year]           MRN: [id number]             PCP: Robert Bellow, PA-C Referring: Rip Harbour Visit Date: 05/06/2022 Occupation: @GUAROCC @  Subjective:  No chief complaint on file.   History of Present Illness: Raymond French is a 61 y.o. male ***   Activities of Daily Living:  Patient reports morning stiffness for *** {minute/hour:19697}.   Patient {ACTIONS;DENIES/REPORTS:21021675::"Denies"} nocturnal pain.  Difficulty dressing/grooming: {ACTIONS;DENIES/REPORTS:21021675::"Denies"} Difficulty climbing stairs: {ACTIONS;DENIES/REPORTS:21021675::"Denies"} Difficulty getting out of chair: {ACTIONS;DENIES/REPORTS:21021675::"Denies"} Difficulty using hands for taps, buttons, cutlery, and/or writing: {ACTIONS;DENIES/REPORTS:21021675::"Denies"}  No Rheumatology ROS completed.   PMFS History:  There are no problems to display for this patient.   Past Medical History:  Diagnosis Date  . Chronic gastritis     Family History  Problem Relation Age of Onset  . Hypertension Mother   . Kidney failure Mother   . Hypertension Father   . Leukemia Father   . Hypertension Sister   . Cancer Sister   . Bradycardia Sister   . Hypertension Sister   . Cancer Brother   . Cancer Brother   . Cancer Brother    Past Surgical History:  Procedure Laterality Date  . EYE SURGERY     right eye  . PAROTID GLAND TUMOR EXCISION Left 1998   Benign Cyst   Social History   Social History Narrative  . Not on file    There is no immunization history on file for this patient.   Objective: Vital Signs: There were no vitals taken for this visit.   Physical Exam   Musculoskeletal Exam: ***  CDAI Exam: CDAI Score: -- Patient Global: --; Provider Global: -- Swollen: --; Tender: -- Joint Exam 05/06/2022   No joint exam has been documented for this visit   There is currently no information documented on  the homunculus. Go to the Rheumatology activity and complete the homunculus joint exam.  Investigation: No additional findings.  Imaging: XR Foot 2 Views Left  Result Date: 04/15/2022 First MTP, PIP and DIP narrowing was noted.  No intertarsal, tibiotalar or subtalar joint space narrowing was noted.  Inferior calcaneal spur was noted. Impression: These findings are consistent with osteoarthritis of the foot.  XR Foot 2 Views Right  Result Date: 04/15/2022 First MTP, PIP and DIP narrowing was noted.  No intertarsal, tibiotalar or subtalar joint space narrowing was noted.  Inferior calcaneal spur was noted. Impression: These findings are consistent with osteoarthritis of the foot.  XR Hand 2 View Left  Result Date: 04/15/2022 CMC, PIP and DIP narrowing was noted.  No MCP narrowing was noted.  Erosive changes were noted at the base of second and fifth proximal phalanx. Impression: These findings are consistent with osteoarthritis and gouty arthropathy overlap.  XR Hand 2 View Right  Result Date: 04/15/2022 PIP and DIP narrowing was noted.  No MCP, intercarpal or radiocarpal joint space narrowing was noted.  Probable cystic versus erosive changes were noted over second, fourth and fifth PIPs. 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   Recent Labs: Lab Results  Component Value Date   WBC 3.8 04/15/2022   HGB 14.9 04/15/2022   PLT 215 04/15/2022   NA 141 04/15/2022   K 4.7 04/15/2022   CL 104 04/15/2022  CO2 30 04/15/2022   GLUCOSE 87 04/15/2022   BUN 15 04/15/2022   CREATININE 1.33 04/15/2022   BILITOT 1.0 04/15/2022   AST 17 04/15/2022   ALT 21 04/15/2022   PROT 7.2 04/15/2022   CALCIUM 9.4 04/15/2022    Speciality Comments: No specialty comments available.  Procedures:  No procedures performed Allergies: Patient has no known allergies.   Assessment / Plan:     Visit Diagnoses: No diagnosis found.  Orders: No  orders of the defined types were placed in this encounter.  No orders of the defined types were placed in this encounter.   Face-to-face time spent with patient was *** minutes. Greater than 50% of time was spent in counseling and coordination of care.  Follow-Up Instructions: No follow-ups on file.   Earnestine Mealing, CMA  Note - This record has been created using Editor, commissioning.  Chart creation errors have been sought, but may not always  have been located. Such creation errors do not reflect on  the standard of medical care.

## 2022-04-27 ENCOUNTER — Ambulatory Visit: Payer: 59 | Admitting: Physician Assistant

## 2022-04-27 DIAGNOSIS — M19042 Primary osteoarthritis, left hand: Secondary | ICD-10-CM

## 2022-04-27 DIAGNOSIS — M19071 Primary osteoarthritis, right ankle and foot: Secondary | ICD-10-CM

## 2022-04-27 DIAGNOSIS — E790 Hyperuricemia without signs of inflammatory arthritis and tophaceous disease: Secondary | ICD-10-CM

## 2022-04-27 DIAGNOSIS — Z8669 Personal history of other diseases of the nervous system and sense organs: Secondary | ICD-10-CM

## 2022-04-27 DIAGNOSIS — Z8719 Personal history of other diseases of the digestive system: Secondary | ICD-10-CM

## 2022-04-27 DIAGNOSIS — M5416 Radiculopathy, lumbar region: Secondary | ICD-10-CM

## 2022-04-27 DIAGNOSIS — Z8739 Personal history of other diseases of the musculoskeletal system and connective tissue: Secondary | ICD-10-CM

## 2022-04-27 DIAGNOSIS — E291 Testicular hypofunction: Secondary | ICD-10-CM

## 2022-04-27 DIAGNOSIS — M79671 Pain in right foot: Secondary | ICD-10-CM

## 2022-04-27 DIAGNOSIS — E349 Endocrine disorder, unspecified: Secondary | ICD-10-CM

## 2022-05-03 NOTE — Progress Notes (Unsigned)
Office Visit Note  Patient: Raymond French             Date of Birth: 1961-04-02           MRN: [id number]             PCP: Robert Bellow, PA-C Referring: Robert Bellow, PA-C Visit Date: 05/06/2022 Occupation: _0 @  Subjective:  Discuss results and treatment options   History of Present Illness: Raymond French is a 61 y.o. male with history of gout and osteoarthritis.  Patient was given a prescription for colchicine 0.6 mg 1 tablet by mouth daily at his initial office visit on 04/15/22.  He presents today to discuss lab results and x-ray findings.    Discuss allopurinol Continue colchicine  Recheck lab work   Activities of Daily Living:  Patient reports morning stiffness for *** {minute/hour:19697}.   Patient {ACTIONS;DENIES/REPORTS:21021675::"Denies"} nocturnal pain.  Difficulty dressing/grooming: {ACTIONS;DENIES/REPORTS:21021675::"Denies"} Difficulty climbing stairs: {ACTIONS;DENIES/REPORTS:21021675::"Denies"} Difficulty getting out of chair: {ACTIONS;DENIES/REPORTS:21021675::"Denies"} Difficulty using hands for taps, buttons, cutlery, and/or writing: {ACTIONS;DENIES/REPORTS:21021675::"Denies"}  No Rheumatology ROS completed.   PMFS History:  There are no problems to display for this patient.   Past Medical History:  Diagnosis Date   Chronic gastritis     Family History  Problem Relation Age of Onset   Hypertension Mother    Kidney failure Mother    Hypertension Father    Leukemia Father    Hypertension Sister    Cancer Sister    Bradycardia Sister    Hypertension Sister    Cancer Brother    Cancer Brother    Cancer Brother    Past Surgical History:  Procedure Laterality Date   EYE SURGERY     right eye   PAROTID GLAND TUMOR EXCISION Left 1998   Benign Cyst   Social History   Social History Narrative   Not on file    There is no immunization history on file for this patient.   Objective: Vital Signs: There were no vitals taken for this  visit.   Physical Exam Vitals and nursing note reviewed.  Constitutional:      Appearance: He is well-developed.  HENT:     Head: Normocephalic and atraumatic.  Eyes:     Conjunctiva/sclera: Conjunctivae normal.     Pupils: Pupils are equal, round, and reactive to light.  Cardiovascular:     Rate and Rhythm: Normal rate and regular rhythm.     Heart sounds: Normal heart sounds.  Pulmonary:     Effort: Pulmonary effort is normal.     Breath sounds: Normal breath sounds.  Abdominal:     General: Bowel sounds are normal.     Palpations: Abdomen is soft.  Musculoskeletal:     Cervical back: Normal range of motion and neck supple.  Skin:    General: Skin is warm and dry.     Capillary Refill: Capillary refill takes less than 2 seconds.  Neurological:     Mental Status: He is alert and oriented to person, place, and time.  Psychiatric:        Behavior: Behavior normal.      Musculoskeletal Exam: ***  CDAI Exam: CDAI Score: -- Patient Global: --; Provider Global: -- Swollen: --; Tender: -- Joint Exam 05/06/2022   No joint exam has been documented for this visit   There is currently no information documented on the homunculus. Go to the Rheumatology activity and complete the homunculus joint exam.  Investigation: No additional findings.  Imaging: XR  Foot 2 Views Left  Result Date: 04/15/2022 First MTP, PIP and DIP narrowing was noted.  No intertarsal, tibiotalar or subtalar joint space narrowing was noted.  Inferior calcaneal spur was noted. Impression: These findings are consistent with osteoarthritis of the foot.  XR Foot 2 Views Right  Result Date: 04/15/2022 First MTP, PIP and DIP narrowing was noted.  No intertarsal, tibiotalar or subtalar joint space narrowing was noted.  Inferior calcaneal spur was noted. Impression: These findings are consistent with osteoarthritis of the foot.  XR Hand 2 View Left  Result Date: 04/15/2022 CMC, PIP and DIP narrowing was  noted.  No MCP narrowing was noted.  Erosive changes were noted at the base of second and fifth proximal phalanx. Impression: These findings are consistent with osteoarthritis and gouty arthropathy overlap.  XR Hand 2 View Right  Result Date: 04/15/2022 PIP and DIP narrowing was noted.  No MCP, intercarpal or radiocarpal joint space narrowing was noted.  Probable cystic versus erosive changes were noted over second, fourth and fifth PIPs. 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   Recent Labs: Lab Results  Component Value Date   WBC 3.8 04/15/2022   HGB 14.9 04/15/2022   PLT 215 04/15/2022   NA 141 04/15/2022   K 4.7 04/15/2022   CL 104 04/15/2022   CO2 30 04/15/2022   GLUCOSE 87 04/15/2022   BUN 15 04/15/2022   CREATININE 1.33 04/15/2022   BILITOT 1.0 04/15/2022   AST 17 04/15/2022   ALT 21 04/15/2022   PROT 7.2 04/15/2022   CALCIUM 9.4 04/15/2022    Speciality Comments: No specialty comments available.  Procedures:  No procedures performed Allergies: Patient has no known allergies.   Assessment / Plan:     Visit Diagnoses: Idiopathic chronic gout of multiple sites without tophus - Uric acid was 7.5 on 04/15/22.  ESR and CRP WNL.  RF, anti-CCP, and 14-3-3 eta negative on 04/15/22.  Medication monitoring encounter - CBC and CMP updated on 04/15/22.  Primary osteoarthritis of both hands - XR of both hands are consistent with osteoarthritis and gouty arthropathy overlap on 04/15/22.  Primary osteoarthritis of both feet - XR of both feet 04/15/22: OA  Radiculopathy, lumbar region  History of spinal stenosis  Hypotestosteronism  Hypogonadism in male  History of gastroesophageal reflux (GERD)  History of glaucoma  Orders: No orders of the defined types were placed in this encounter.  No orders of the defined types were placed in this encounter.    Follow-Up Instructions: No follow-ups on file.   Ofilia Neas, PA-C  Note - This record has been created using Dragon software.  Chart creation errors have been sought, but may not always  have been located. Such creation errors do not reflect on  the standard of medical care.

## 2022-05-06 ENCOUNTER — Ambulatory Visit: Payer: 59 | Attending: Physician Assistant | Admitting: Physician Assistant

## 2022-05-06 ENCOUNTER — Encounter: Payer: Self-pay | Admitting: Physician Assistant

## 2022-05-06 VITALS — BP 131/85 | HR 58 | Resp 17 | Ht 68.0 in | Wt 221.6 lb

## 2022-05-06 DIAGNOSIS — M19041 Primary osteoarthritis, right hand: Secondary | ICD-10-CM | POA: Diagnosis not present

## 2022-05-06 DIAGNOSIS — Z8669 Personal history of other diseases of the nervous system and sense organs: Secondary | ICD-10-CM

## 2022-05-06 DIAGNOSIS — E291 Testicular hypofunction: Secondary | ICD-10-CM

## 2022-05-06 DIAGNOSIS — Z8719 Personal history of other diseases of the digestive system: Secondary | ICD-10-CM

## 2022-05-06 DIAGNOSIS — M19071 Primary osteoarthritis, right ankle and foot: Secondary | ICD-10-CM

## 2022-05-06 DIAGNOSIS — M1A09X0 Idiopathic chronic gout, multiple sites, without tophus (tophi): Secondary | ICD-10-CM | POA: Diagnosis not present

## 2022-05-06 DIAGNOSIS — Z5181 Encounter for therapeutic drug level monitoring: Secondary | ICD-10-CM

## 2022-05-06 DIAGNOSIS — Z8739 Personal history of other diseases of the musculoskeletal system and connective tissue: Secondary | ICD-10-CM

## 2022-05-06 DIAGNOSIS — E349 Endocrine disorder, unspecified: Secondary | ICD-10-CM

## 2022-05-06 DIAGNOSIS — M19072 Primary osteoarthritis, left ankle and foot: Secondary | ICD-10-CM

## 2022-05-06 DIAGNOSIS — M19042 Primary osteoarthritis, left hand: Secondary | ICD-10-CM

## 2022-05-06 DIAGNOSIS — M5416 Radiculopathy, lumbar region: Secondary | ICD-10-CM

## 2022-05-06 NOTE — Patient Instructions (Signed)

## 2022-07-20 NOTE — Progress Notes (Deleted)
Office Visit Note  Patient: Raymond French             Date of Birth: April 19, 1961           MRN: [id number]             PCP: Robert Bellow, PA-C Referring: Robert Bellow, PA-C Visit Date: 08/03/2022 Occupation: '@GUAROCC'$ @  Subjective:    History of Present Illness: Raymond French is a 62 y.o. male with history of gout and osteoarthritis.  Patient takes colchicine 0.6 mg 1 tablet daily as needed during flares.  He has not currently taking a urate lowering medication.  CBC and CMP were drawn on 04/15/2022.  Uric acid was 7.5 on 04/15/2022.  Uric acid level be rechecked today.  Goal uric acid level should be less than 6.  He is not currently taking a urate lowering medication.  Discussed the importance of avoiding a purine rich diet.   Activities of Daily Living:  Patient reports morning stiffness for *** {minute/hour:19697}.   Patient {ACTIONS;DENIES/REPORTS:21021675::"Denies"} nocturnal pain.  Difficulty dressing/grooming: {ACTIONS;DENIES/REPORTS:21021675::"Denies"} Difficulty climbing stairs: {ACTIONS;DENIES/REPORTS:21021675::"Denies"} Difficulty getting out of chair: {ACTIONS;DENIES/REPORTS:21021675::"Denies"} Difficulty using hands for taps, buttons, cutlery, and/or writing: {ACTIONS;DENIES/REPORTS:21021675::"Denies"}  No Rheumatology ROS completed.   PMFS History:  There are no problems to display for this patient.   Past Medical History:  Diagnosis Date   Chronic gastritis     Family History  Problem Relation Age of Onset   Hypertension Mother    Kidney failure Mother    Hypertension Father    Leukemia Father    Hypertension Sister    Cancer Sister    Bradycardia Sister    Hypertension Sister    Cancer Brother    Cancer Brother    Cancer Brother    Past Surgical History:  Procedure Laterality Date   EYE SURGERY     right eye   PAROTID GLAND TUMOR EXCISION Left 1998   Benign Cyst   Social History   Social History Narrative   Not on file    There  is no immunization history on file for this patient.   Objective: Vital Signs: There were no vitals taken for this visit.   Physical Exam Vitals and nursing note reviewed.  Constitutional:      Appearance: He is well-developed.  HENT:     Head: Normocephalic and atraumatic.  Eyes:     Conjunctiva/sclera: Conjunctivae normal.     Pupils: Pupils are equal, round, and reactive to light.  Cardiovascular:     Rate and Rhythm: Normal rate and regular rhythm.     Heart sounds: Normal heart sounds.  Pulmonary:     Effort: Pulmonary effort is normal.     Breath sounds: Normal breath sounds.  Abdominal:     General: Bowel sounds are normal.     Palpations: Abdomen is soft.  Musculoskeletal:     Cervical back: Normal range of motion and neck supple.  Skin:    General: Skin is warm and dry.     Capillary Refill: Capillary refill takes less than 2 seconds.  Neurological:     Mental Status: He is alert and oriented to person, place, and time.  Psychiatric:        Behavior: Behavior normal.      Musculoskeletal Exam: ***  CDAI Exam: CDAI Score: -- Patient Global: --; Provider Global: -- Swollen: --; Tender: -- Joint Exam 08/03/2022   No joint exam has been documented for this visit   There is  currently no information documented on the homunculus. Go to the Rheumatology activity and complete the homunculus joint exam.  Investigation: No additional findings.  Imaging: No results found.  Recent Labs: Lab Results  Component Value Date   WBC 3.8 04/15/2022   HGB 14.9 04/15/2022   PLT 215 04/15/2022   NA 141 04/15/2022   K 4.7 04/15/2022   CL 104 04/15/2022   CO2 30 04/15/2022   GLUCOSE 87 04/15/2022   BUN 15 04/15/2022   CREATININE 1.33 04/15/2022   BILITOT 1.0 04/15/2022   AST 17 04/15/2022   ALT 21 04/15/2022   PROT 7.2 04/15/2022   CALCIUM 9.4 04/15/2022    Speciality Comments: No specialty comments available.  Procedures:  No procedures  performed Allergies: Patient has no known allergies.   Assessment / Plan:     Visit Diagnoses: No diagnosis found.  Orders: No orders of the defined types were placed in this encounter.  No orders of the defined types were placed in this encounter.   Face-to-face time spent with patient was *** minutes. Greater than 50% of time was spent in counseling and coordination of care.  Follow-Up Instructions: No follow-ups on file.   Earnestine Mealing, CMA  Note - This record has been created using Editor, commissioning.  Chart creation errors have been sought, but may not always  have been located. Such creation errors do not reflect on  the standard of medical care.

## 2022-08-03 ENCOUNTER — Ambulatory Visit: Payer: 59 | Admitting: Physician Assistant

## 2022-08-03 DIAGNOSIS — E291 Testicular hypofunction: Secondary | ICD-10-CM

## 2022-08-03 DIAGNOSIS — Z8669 Personal history of other diseases of the nervous system and sense organs: Secondary | ICD-10-CM

## 2022-08-03 DIAGNOSIS — E349 Endocrine disorder, unspecified: Secondary | ICD-10-CM

## 2022-08-03 DIAGNOSIS — Z8719 Personal history of other diseases of the digestive system: Secondary | ICD-10-CM

## 2022-08-03 DIAGNOSIS — Z8739 Personal history of other diseases of the musculoskeletal system and connective tissue: Secondary | ICD-10-CM

## 2022-08-03 DIAGNOSIS — M19071 Primary osteoarthritis, right ankle and foot: Secondary | ICD-10-CM

## 2022-08-03 DIAGNOSIS — M5416 Radiculopathy, lumbar region: Secondary | ICD-10-CM

## 2022-08-03 DIAGNOSIS — M1A09X0 Idiopathic chronic gout, multiple sites, without tophus (tophi): Secondary | ICD-10-CM

## 2022-08-03 DIAGNOSIS — Z5181 Encounter for therapeutic drug level monitoring: Secondary | ICD-10-CM

## 2022-08-03 DIAGNOSIS — M19041 Primary osteoarthritis, right hand: Secondary | ICD-10-CM

## 2022-10-26 ENCOUNTER — Emergency Department (HOSPITAL_BASED_OUTPATIENT_CLINIC_OR_DEPARTMENT_OTHER): Payer: 59 | Admitting: Radiology

## 2022-10-26 ENCOUNTER — Encounter (HOSPITAL_BASED_OUTPATIENT_CLINIC_OR_DEPARTMENT_OTHER): Payer: Self-pay | Admitting: Emergency Medicine

## 2022-10-26 ENCOUNTER — Other Ambulatory Visit: Payer: Self-pay

## 2022-10-26 ENCOUNTER — Emergency Department (HOSPITAL_BASED_OUTPATIENT_CLINIC_OR_DEPARTMENT_OTHER)
Admission: EM | Admit: 2022-10-26 | Discharge: 2022-10-26 | Disposition: A | Discharge status: Home / Self Care | Payer: 59 | Attending: Emergency Medicine | Admitting: Emergency Medicine

## 2022-10-26 DIAGNOSIS — Z20822 Contact with and (suspected) exposure to covid-19: Secondary | ICD-10-CM | POA: Diagnosis not present

## 2022-10-26 DIAGNOSIS — R051 Acute cough: Secondary | ICD-10-CM

## 2022-10-26 DIAGNOSIS — R0981 Nasal congestion: Secondary | ICD-10-CM

## 2022-10-26 DIAGNOSIS — J189 Pneumonia, unspecified organism: Secondary | ICD-10-CM | POA: Diagnosis not present

## 2022-10-26 DIAGNOSIS — R059 Cough, unspecified: Secondary | ICD-10-CM | POA: Diagnosis present

## 2022-10-26 DIAGNOSIS — Z87891 Personal history of nicotine dependence: Secondary | ICD-10-CM | POA: Insufficient documentation

## 2022-10-26 LAB — RESP PANEL BY RT-PCR (RSV, FLU A&B, COVID)  RVPGX2
Influenza A by PCR: NEGATIVE
Influenza B by PCR: NEGATIVE
Resp Syncytial Virus by PCR: NEGATIVE
SARS Coronavirus 2 by RT PCR: NEGATIVE

## 2022-10-26 MED ORDER — ACETAMINOPHEN 500 MG PO TABS
1000.0000 mg | ORAL_TABLET | Freq: Once | ORAL | Status: AC
  Administered 2022-10-26: 1000 mg via ORAL
  Filled 2022-10-26: qty 2

## 2022-10-26 MED ORDER — CETIRIZINE-PSEUDOEPHEDRINE ER 5-120 MG PO TB12: 1.0000 | ORAL_TABLET | Freq: Every day | ORAL | 0 refills | Status: AC | PRN

## 2022-10-26 MED ORDER — BENZONATATE 100 MG PO CAPS: 100.0000 mg | ORAL_CAPSULE | Freq: Three times a day (TID) | ORAL | 0 refills | Status: AC | PRN

## 2022-10-26 MED ORDER — AZITHROMYCIN 250 MG PO TABS: 250.0000 mg | ORAL_TABLET | Freq: Every day | ORAL | 0 refills | Status: AC

## 2022-10-26 MED ORDER — AMOXICILLIN 500 MG PO CAPS: 1000.0000 mg | ORAL_CAPSULE | Freq: Every day | ORAL | 0 refills | Status: AC

## 2022-10-26 NOTE — Discharge Instructions (Addendum)
The workup today was overall consistent with pneumonia.  Will treat this with antibiotics as discussed.  Will also send in cough suppressant to use at night as needed for persistent cough.  Please follow-up with primary care for reassessment of your symptoms.  Please do not hesitate to return to emergency department for worrisome signs and symptoms we discussed become apparent.

## 2022-10-26 NOTE — ED Provider Notes (Signed)
[place] EMERGENCY DEPARTMENT AT [facility] Provider Note   CSN: [id number] Arrival date & time: 10/26/22  4098     History  Chief Complaint  Patient presents with   Fever   Headache    Raymond French is a 62 y.o. male.   Fever Associated symptoms: headaches   Headache Associated symptoms: fever     62 year old male presents emergency department with complaints of cough, nasal congestion, headache, intermittent fever.  Patient states that he has been with symptoms on Friday.  Has known sick exposure to fellow employee on Friday who had similar symptoms.  Has been taking at home Mucinex as well as Tylenol which has helped symptoms some.  Presents emergency department due to persistence of symptoms.  Describes headache as frontal in nature without radiation.  Denies visual disturbance, gait abnormality, slurred speech, facial droop, weakness/sensory deficits in upper extremities.  Describes cough as sometimes productive.  Denies chest pain, shortness of breath, abdominal pain, nausea, vomiting, urinary symptoms, change in bowel habits.  Past medical history significant for chronic gastritis, gout, glaucoma, seasonal allergies, hypotestosteronism  Home Medications Prior to Admission medications   Medication Sig Start Date End Date Taking? Authorizing Provider  amoxicillin (AMOXIL) 500 MG capsule Take 2 capsules (1,000 mg total) by mouth daily for 5 days. 10/26/22 10/31/22 Yes Sherian Maroon A, PA  azithromycin (ZITHROMAX) 250 MG tablet Take 1 tablet (250 mg total) by mouth daily. Take first 2 tablets together, then 1 every day until finished. 10/26/22  Yes Sherian Maroon A, PA  benzonatate (TESSALON) 100 MG capsule Take 1 capsule (100 mg total) by mouth 3 (three) times daily as needed for cough (Take nightly as needed for persistent cough). 10/26/22  Yes Sherian Maroon A, PA  cetirizine-pseudoephedrine (ZYRTEC-D) 5-120 MG tablet Take 1 tablet by mouth daily as needed for  allergies or rhinitis. 10/26/22  Yes Sherian Maroon A, PA  azelastine (ASTELIN) 0.1 % nasal spray     [provider]  meloxicam (MOBIC) 15 MG tablet as needed. 09/11/20   [provider]  nitroGLYCERIN (NITROGLYN) 2 % ointment as needed. 01/14/22   [provider]  pantoprazole (PROTONIX) 40 MG tablet Take 40 mg by mouth daily. 02/18/22   [provider]  sildenafil (VIAGRA) 50 MG tablet     [provider]  Testosterone 25 MG/2.5GM (1%) GEL Apply topically.    [provider]  timolol (TIMOPTIC) 0.5 % ophthalmic solution 1 drop daily. 02/11/22   [provider]      Allergies    Aspirin    Review of Systems   Review of Systems  Constitutional:  Positive for fever.  Neurological:  Positive for headaches.  All other systems reviewed and are negative.   Physical Exam Updated Vital Signs BP (!) 150/98 (BP Location: Right Arm)   Pulse 93   Temp 99.6 F (37.6 C) (Oral)   Resp 15   SpO2 95%  Physical Exam Vitals and nursing note reviewed.  Constitutional:      General: He is not in acute distress.    Appearance: He is well-developed.  HENT:     Head: Normocephalic and atraumatic.  Eyes:     Conjunctiva/sclera: Conjunctivae normal.  Neck:     Comments: Kernig and Brudzinski negative Cardiovascular:     Rate and Rhythm: Normal rate and regular rhythm.     Heart sounds: No murmur heard. Pulmonary:     Effort: Pulmonary effort is normal. No respiratory distress.  Breath sounds: Rhonchi and rales present.     Comments: Renal/rhonchi auscultated bilateral lower lung fields with right greater than left. Abdominal:     Palpations: Abdomen is soft.     Tenderness: There is no abdominal tenderness.  Musculoskeletal:        General: No swelling.     Cervical back: Neck supple. No rigidity or tenderness.  Skin:    General: Skin is warm and dry.     Capillary Refill: Capillary refill takes less than 2 seconds.   Neurological:     Mental Status: He is alert.     Comments: Alert and oriented to self, place, time and event.   Speech is fluent, clear without dysarthria or dysphasia.   Strength 5/5 in upper/lower extremities   Sensation intact in upper/lower extremities   Normal gait.  Negative Romberg. No pronator drift.  Normal finger-to-nose and feet tapping.  CN I not tested  CN II not tested CN III, IV, VI PERRLA and EOMs intact bilaterally  CN V Intact sensation to sharp and light touch to the face  CN VII facial movements symmetric  CN VIII not tested  CN IX, X no uvula deviation, symmetric rise of soft palate  CN XI 5/5 SCM and trapezius strength bilaterally  CN XII Midline tongue protrusion, symmetric L/R movements     Psychiatric:        Mood and Affect: Mood normal.     ED Results / Procedures / Treatments   Labs (all labs ordered are listed, but only abnormal results are displayed) Labs Reviewed  RESP PANEL BY RT-PCR (RSV, FLU A&B, COVID)  [id number]    EKG None  Radiology DG Chest 2 View  Result Date: 10/26/2022 CLINICAL DATA:  Cough, fever EXAM: CHEST - 2 VIEW COMPARISON:  None Available. FINDINGS: The heart size and mediastinal contours are within normal limits. Slightly low lung volumes. Bibasilar interstitial opacities are slightly more prominent on the right. No pleural effusion or pneumothorax. The visualized skeletal structures are unremarkable. IMPRESSION: Bibasilar interstitial opacities are slightly more prominent on the right, which may reflect atypical/viral infection. Electronically Signed   By: Duanne Guess D.O.   On: 10/26/2022 10:33    Procedures Procedures    Medications Ordered in ED Medications  acetaminophen (TYLENOL) tablet 1,000 mg (1,000 mg Oral Given 10/26/22 1058)    ED Course/ Medical Decision Making/ A&P                             Medical Decision Making Amount and/or Complexity of Data Reviewed Radiology: ordered.  Risk OTC  drugs. Prescription drug management.   This patient presents to the ED for concern of cough/congestion, this involves an extensive number of treatment options, and is a complaint that carries with it a high risk of complications and morbidity.  The differential diagnosis includes influenza, COVID, RSV, viral URI, pneumonia, sepsis, meningitis, CVA   Co morbidities that complicate the patient evaluation  See HPI   Additional history obtained:  Additional history obtained from EMR External records from outside source obtained and reviewed including hospital records   Lab Tests:  I Ordered, and personally interpreted labs.  The pertinent results include: Respiratory viral panel negative   Imaging Studies ordered:  I ordered imaging studies including chest x-ray I independently visualized and interpreted imaging which showed bibasilar interstitial opacities slightly more prominent on right I agree with the radiologist interpretation  Cardiac Monitoring: /  EKG:  The patient was maintained on a cardiac monitor.  I personally viewed and interpreted the cardiac monitored which showed an underlying rhythm of: Sinus rhythm   Consultations Obtained:  N/a   Problem List / ED Course / Critical interventions / Medication management  Cough, nasal congestion I ordered medication including Tylenol   Reevaluation of the patient after these medicines showed that the patient improved I have reviewed the patients home medicines and have made adjustments as needed   Social Determinants of Health:  Former cigarette use.  Denies illicit drug use.   Test / Admission - Considered:  Cough, nasal congestion Vitals signs  within normal range and stable throughout visit. Laboratory/imaging studies significant for: See above 62 year old male presents emergency department with complaints of 4 to 5-day history of cough, nasal congestion, headache.  Regarding headache, patient without any  acute neurologic deficit with no evidence of meningismus.  Headache seems to respond to over-the-counter Tylenol/Motrin.  Low suspicion for CVA, meningitis/encephalitis.  Regarding patient's cough, some suspicion for pneumonia given no/rhonchi auscultated on chest x-ray findings of bibasilar opacities.  Will treat for community-acquired pneumonia and recommend further symptomatic therapy with antihistamine, nasal steroid spray and decongestant to use as needed.  Patient overall well-appearing, afebrile in no acute respiratory distress.  Close follow-up with primary care recommended for reevaluation of symptoms.  Treatment plan discussed at length with patient and he acknowledged understanding was agreeable to the plan. Worrisome signs and symptoms were discussed with the patient, and the patient acknowledged understanding to return to the ED if noticed. Patient was stable upon discharge.          Final Clinical Impression(s) / ED Diagnoses Final diagnoses:  Acute cough  Nasal congestion  Community acquired pneumonia, unspecified laterality    Rx / DC Orders ED Discharge Orders          Ordered    azithromycin (ZITHROMAX) 250 MG tablet  Daily        10/26/22 1043    amoxicillin (AMOXIL) 500 MG capsule  Daily        10/26/22 1043    cetirizine-pseudoephedrine (ZYRTEC-D) 5-120 MG tablet  Daily PRN        10/26/22 1043    benzonatate (TESSALON) 100 MG capsule  3 times daily PRN        10/26/22 1043              Peter Garter, [place] 10/26/22 1229    Ernie Avena, MD 10/26/22 1440

## 2022-10-26 NOTE — ED Notes (Signed)
Discharge paperwork given and verbally understood. 

## 2022-10-26 NOTE — ED Notes (Signed)
Pt requested to talk to the Provider one last time before leaving.Marland KitchenMarland Kitchen

## 2022-10-26 NOTE — ED Triage Notes (Signed)
Pt arrives to ED with c/o fever, chills, cough, headache.

## 2023-06-14 ENCOUNTER — Other Ambulatory Visit: Payer: Self-pay | Admitting: Physician Assistant

## 2023-07-15 ENCOUNTER — Other Ambulatory Visit: Payer: Self-pay | Admitting: Urology

## 2023-12-03 ENCOUNTER — Emergency Department (HOSPITAL_BASED_OUTPATIENT_CLINIC_OR_DEPARTMENT_OTHER)

## 2023-12-03 ENCOUNTER — Emergency Department (HOSPITAL_BASED_OUTPATIENT_CLINIC_OR_DEPARTMENT_OTHER)
Admission: EM | Admit: 2023-12-03 | Discharge: 2023-12-03 | Disposition: A | Discharge status: Home / Self Care | Attending: Emergency Medicine | Admitting: Emergency Medicine

## 2023-12-03 ENCOUNTER — Encounter (HOSPITAL_BASED_OUTPATIENT_CLINIC_OR_DEPARTMENT_OTHER): Payer: Self-pay

## 2023-12-03 DIAGNOSIS — R059 Cough, unspecified: Secondary | ICD-10-CM | POA: Diagnosis present

## 2023-12-03 DIAGNOSIS — J069 Acute upper respiratory infection, unspecified: Secondary | ICD-10-CM | POA: Insufficient documentation

## 2023-12-03 LAB — CBC WITH DIFFERENTIAL/PLATELET
Abs Immature Granulocytes: 0 10*3/uL (ref 0.00–0.07)
Basophils Absolute: 0 10*3/uL (ref 0.0–0.1)
Basophils Relative: 1 %
Eosinophils Absolute: 0.1 10*3/uL (ref 0.0–0.5)
Eosinophils Relative: 2 %
HCT: 45.2 % (ref 39.0–52.0)
Hemoglobin: 14.6 g/dL (ref 13.0–17.0)
Immature Granulocytes: 0 %
Lymphocytes Relative: 44 %
Lymphs Abs: 1.5 10*3/uL (ref 0.7–4.0)
MCH: 28.7 pg (ref 26.0–34.0)
MCHC: 32.3 g/dL (ref 30.0–36.0)
MCV: 88.8 fL (ref 80.0–100.0)
Monocytes Absolute: 0.8 10*3/uL (ref 0.1–1.0)
Monocytes Relative: 22 %
Neutro Abs: 1.1 10*3/uL — ABNORMAL LOW (ref 1.7–7.7)
Neutrophils Relative %: 31 %
Platelets: 168 10*3/uL (ref 150–400)
RBC: 5.09 MIL/uL (ref 4.22–5.81)
RDW: 13.4 % (ref 11.5–15.5)
WBC: 3.4 10*3/uL — ABNORMAL LOW (ref 4.0–10.5)
nRBC: 0 % (ref 0.0–0.2)

## 2023-12-03 LAB — BASIC METABOLIC PANEL WITH GFR
Anion gap: 12 (ref 5–15)
BUN: 15 mg/dL (ref 8–23)
CO2: 24 mmol/L (ref 22–32)
Calcium: 9.6 mg/dL (ref 8.9–10.3)
Chloride: 102 mmol/L (ref 98–111)
Creatinine, Ser: 1.39 mg/dL — ABNORMAL HIGH (ref 0.61–1.24)
GFR, Estimated: 58 mL/min — ABNORMAL LOW (ref >60)
Glucose, Bld: 93 mg/dL (ref 70–99)
Potassium: 4.3 mmol/L (ref 3.5–5.1)
Sodium: 138 mmol/L (ref 135–145)

## 2023-12-03 MED ORDER — DEXAMETHASONE 4 MG PO TABS
10.0000 mg | ORAL_TABLET | Freq: Once | ORAL | Status: AC
  Administered 2023-12-03: 10 mg via ORAL
  Filled 2023-12-03: qty 3

## 2023-12-03 NOTE — ED Provider Notes (Signed)
 [place] EMERGENCY DEPARTMENT AT [facility] Provider Note   CSN: [id number] Arrival date & time: 12/03/23  7829     History  Chief Complaint  Patient presents with   URI    Josiah Nieto is a 63 y.o. male.  Patient here with cough and congestion.  Wife diagnosed with flu a couple days ago.  He started feeling sick a day after her.  Denies any nausea vomiting diarrhea.  No chest pain or shortness of breath.  Denies any diarrhea.  Has been able to hydrate.  History of reflux.  No abdominal pain.  The history is provided by the patient.       Home Medications Prior to Admission medications   Medication Sig Start Date End Date Taking? Authorizing Provider  azelastine (ASTELIN) 0.1 % nasal spray     [provider]  azithromycin  (ZITHROMAX ) 250 MG tablet Take 1 tablet (250 mg total) by mouth daily. Take first 2 tablets together, then 1 every day until finished. 10/26/22   [place] Butter, PA  benzonatate  (TESSALON ) 100 MG capsule Take 1 capsule (100 mg total) by mouth 3 (three) times daily as needed for cough (Take nightly as needed for persistent cough). 10/26/22   [place] Butter, PA  cetirizine -pseudoephedrine  (ZYRTEC -D) 5-120 MG tablet Take 1 tablet by mouth daily as needed for allergies or rhinitis. 10/26/22   [place] Butter, PA  meloxicam (MOBIC) 15 MG tablet as needed. 09/11/20   [provider]  nitroGLYCERIN (NITROGLYN) 2 % ointment as needed. 01/14/22   [provider]  pantoprazole (PROTONIX) 40 MG tablet Take 40 mg by mouth daily. 02/18/22   [provider]  sildenafil (VIAGRA) 50 MG tablet     [provider]  Testosterone 25 MG/2.5GM (1%) GEL Apply topically.    [provider]  timolol (TIMOPTIC) 0.5 % ophthalmic solution 1 drop daily. 02/11/22   [provider]      Allergies    Aspirin    Review of Systems   Review of Systems  Physical Exam Updated Vital Signs BP (!) 140/90 (BP  Location: Right Arm)   Pulse 73   Temp 98 F (36.7 C) (Oral)   Resp (!) 23   SpO2 96%  Physical Exam Vitals and nursing note reviewed.  Constitutional:      General: He is not in acute distress.    Appearance: He is well-developed. He is not ill-appearing.  HENT:     Head: Normocephalic and atraumatic.     Nose: Nose normal.     Mouth/Throat:     Mouth: Mucous membranes are moist.  Eyes:     Extraocular Movements: Extraocular movements intact.     Conjunctiva/sclera: Conjunctivae normal.     Pupils: Pupils are equal, round, and reactive to light.  Cardiovascular:     Rate and Rhythm: Normal rate and regular rhythm.     Pulses: Normal pulses.     Heart sounds: Normal heart sounds. No murmur heard. Pulmonary:     Effort: Pulmonary effort is normal. No respiratory distress.     Breath sounds: Normal breath sounds.  Abdominal:     Palpations: Abdomen is soft.     Tenderness: There is no abdominal tenderness.  Musculoskeletal:        General: No swelling.     Cervical back: Normal range of motion and neck supple.  Skin:    General: Skin is warm and dry.     Capillary Refill: Capillary refill  takes less than 2 seconds.  Neurological:     General: No focal deficit present.     Mental Status: He is alert and oriented to person, place, and time.     Cranial Nerves: No cranial nerve deficit.     Sensory: No sensory deficit.     Motor: No weakness.     Coordination: Coordination normal.  Psychiatric:        Mood and Affect: Mood normal.     ED Results / Procedures / Treatments   Labs (all labs ordered are listed, but only abnormal results are displayed) Labs Reviewed  CBC WITH DIFFERENTIAL/PLATELET - Abnormal; Notable for the following components:      Result Value   WBC 3.4 (*)    Neutro Abs 1.1 (*)    All other components within normal limits  BASIC METABOLIC PANEL WITH GFR - Abnormal; Notable for the following components:   Creatinine, Ser 1.39 (*)    GFR, Estimated  58 (*)    All other components within normal limits    EKG EKG Interpretation Date/Time:  Saturday December 03 2023 08:27:37 EDT Ventricular Rate:  76 PR Interval:  143 QRS Duration:  90 QT Interval:  367 QTC Calculation: 413 R Axis:   57  Text Interpretation: Sinus rhythm Confirmed by Lowery Rue [phone]) on 12/03/2023 8:38:39 AM  Radiology DG Chest Portable 1 View Result Date: 12/03/2023 CLINICAL DATA:  Cough. EXAM: PORTABLE CHEST 1 VIEW COMPARISON:  Chest radiograph dated 10/26/2022. FINDINGS: No focal consolidation, pleural effusion, or pneumothorax. The cardiac silhouette is within normal limits. No acute osseous pathology. IMPRESSION: No active disease. Electronically Signed   By: Angus Bark M.D.   On: 12/03/2023 08:55    Procedures Procedures    Medications Ordered in ED Medications  dexamethasone (DECADRON) tablet 10 mg (has no administration in time range)    ED Course/ Medical Decision Making/ A&P                                 Medical Decision Making Amount and/or Complexity of Data Reviewed Labs: ordered. Radiology: ordered.   Gabryel Files is here with URI symptoms.  Normal vitals.  No fever.  Very well-appearing.  Clear breath sounds.  Wife diagnosis flu this past week.  He started to get sick a day after her.  He has been able to hydrate.  He denies any sputum production but has had a cough.  Has had some congestion.  No chest pain or shortness of breath.  Differential diagnosis likely viral process given exposure to flu I.  Have no concern for ACS PE or other acute process.  However will check basic labs CBC BMP EKG chest x-ray to see if there is any electrolyte abnormality dehydration or pneumonia.  Per my review interpretation labs no significant leukocytosis anemia or electrolyte abnormality.  EKG shows sinus rhythm.  No ischemic changes.  Overall recommend continued supportive care at home with Tylenol  and ibuprofen  and rest.  Given Decadron for  symptomatic relief.  Discharged in good condition.  This chart was dictated using voice recognition software.  Despite best efforts to proofread,  errors can occur which can change the documentation meaning.         Final Clinical Impression(s) / ED Diagnoses Final diagnoses:  Upper respiratory tract infection, unspecified type    Rx / DC Orders ED Discharge Orders     None  Lowery Rue, DO 12/03/23 [phone]

## 2023-12-03 NOTE — ED Triage Notes (Signed)
 He reports non-productive cough with low grade fevers since this Wed. He further tells us  that his wife was dx with Flu A this Tues. He is ambulatory and in no distress.
